# Patient Record
Sex: Female | Born: 1990
Health system: Southern US, Community
[De-identification: ages and names within clinical notes are randomized; demographics above are authoritative.]

---

## 2019-06-18 MED FILL — TRI FEMYNOR 28 TABLET: 0.18/0.215/ | 84 days supply | Qty: 84 | Fill #0

## 2019-09-12 MED FILL — TRI FEMYNOR 28 TABLET: 0.18/0.215/ | 84 days supply | Qty: 84 | Fill #1

## 2019-11-27 ENCOUNTER — Other Ambulatory Visit: Payer: Self-pay

## 2019-11-27 ENCOUNTER — Encounter (HOSPITAL_COMMUNITY): Payer: Self-pay | Admitting: Emergency Medicine

## 2019-11-27 ENCOUNTER — Emergency Department (HOSPITAL_COMMUNITY)
Admission: EM | Admit: 2019-11-27 | Discharge: 2019-11-28 | Disposition: A | Discharge status: Home / Self Care | Payer: 59 | Attending: Emergency Medicine | Admitting: Emergency Medicine

## 2019-11-27 DIAGNOSIS — R109 Unspecified abdominal pain: Secondary | ICD-10-CM | POA: Diagnosis not present

## 2019-11-27 DIAGNOSIS — R1084 Generalized abdominal pain: Secondary | ICD-10-CM | POA: Diagnosis present

## 2019-11-27 NOTE — ED Triage Notes (Signed)
Patient with abdominal pain since Sunday, getting progressively worse since Sunday.  Today was intermittent, but got worse after eating some soup.  No nausea, no vomiting, but the pain became severe.

## 2019-11-28 ENCOUNTER — Emergency Department (HOSPITAL_COMMUNITY): Payer: 59

## 2019-11-28 LAB — URINALYSIS, ROUTINE W REFLEX MICROSCOPIC
Bilirubin Urine: NEGATIVE
Glucose, UA: NEGATIVE mg/dL
Hgb urine dipstick: NEGATIVE
Ketones, ur: NEGATIVE mg/dL
Leukocytes,Ua: NEGATIVE
Nitrite: NEGATIVE
Protein, ur: NEGATIVE mg/dL
Specific Gravity, Urine: 1.013 (ref 1.005–1.030)
pH: 7 (ref 5.0–8.0)

## 2019-11-28 LAB — COMPREHENSIVE METABOLIC PANEL
ALT: 20 U/L (ref 0–44)
AST: 25 U/L (ref 15–41)
Albumin: 4 g/dL (ref 3.5–5.0)
Alkaline Phosphatase: 51 U/L (ref 38–126)
Anion gap: 12 (ref 5–15)
BUN: 13 mg/dL (ref 6–20)
CO2: 24 mmol/L (ref 22–32)
Calcium: 9.3 mg/dL (ref 8.9–10.3)
Chloride: 102 mmol/L (ref 98–111)
Creatinine, Ser: 0.76 mg/dL (ref 0.44–1.00)
GFR calc Af Amer: >60 mL/min (ref >60)
GFR calc non Af Amer: >60 mL/min (ref >60)
Glucose, Bld: 110 mg/dL — ABNORMAL HIGH (ref 70–99)
Potassium: 3.9 mmol/L (ref 3.5–5.1)
Sodium: 138 mmol/L (ref 135–145)
Total Bilirubin: 0.5 mg/dL (ref 0.3–1.2)
Total Protein: 7.1 g/dL (ref 6.5–8.1)

## 2019-11-28 LAB — I-STAT BETA HCG BLOOD, ED (MC, WL, AP ONLY): I-stat hCG, quantitative: <5 m[IU]/mL (ref <5)

## 2019-11-28 LAB — CBC WITH DIFFERENTIAL/PLATELET
Abs Immature Granulocytes: 0 10*3/uL (ref 0.00–0.07)
Basophils Absolute: 0 10*3/uL (ref 0.0–0.1)
Basophils Relative: 0 %
Eosinophils Absolute: 0.1 10*3/uL (ref 0.0–0.5)
Eosinophils Relative: 2 %
HCT: 41.1 % (ref 36.0–46.0)
Hemoglobin: 13.3 g/dL (ref 12.0–15.0)
Immature Granulocytes: 0 %
Lymphocytes Relative: 42 %
Lymphs Abs: 2.4 10*3/uL (ref 0.7–4.0)
MCH: 29.8 pg (ref 26.0–34.0)
MCHC: 32.4 g/dL (ref 30.0–36.0)
MCV: 92.2 fL (ref 80.0–100.0)
Monocytes Absolute: 0.4 10*3/uL (ref 0.1–1.0)
Monocytes Relative: 7 %
Neutro Abs: 2.8 10*3/uL (ref 1.7–7.7)
Neutrophils Relative %: 49 %
Platelets: 232 10*3/uL (ref 150–400)
RBC: 4.46 MIL/uL (ref 3.87–5.11)
RDW: 12.5 % (ref 11.5–15.5)
WBC: 5.7 10*3/uL (ref 4.0–10.5)
nRBC: 0 % (ref 0.0–0.2)

## 2019-11-28 LAB — LIPASE, BLOOD: Lipase: 33 U/L (ref 11–51)

## 2019-11-28 MED ORDER — SODIUM CHLORIDE 0.9 % IV BOLUS
1000.0000 mL | Freq: Once | INTRAVENOUS | Status: AC
  Administered 2019-11-28: 1000 mL via INTRAVENOUS

## 2019-11-28 MED ORDER — SIMETHICONE 40 MG/0.6ML PO SUSP
40.0000 mg | Freq: Once | ORAL | Status: AC
  Administered 2019-11-28: 40 mg via ORAL
  Filled 2019-11-28: qty 0.6

## 2019-11-28 MED ORDER — IOHEXOL 300 MG/ML  SOLN
100.0000 mL | Freq: Once | INTRAMUSCULAR | Status: AC | PRN
  Administered 2019-11-28: 100 mL via INTRAVENOUS

## 2019-11-28 NOTE — ED Provider Notes (Signed)
[facility] EMERGENCY DEPARTMENT Provider Note   CSN: [id number] Arrival date & time: 11/27/19  2344     History Chief Complaint  Patient presents with  . Abdominal Pain    Bethany Chapman is a 29 y.o. female.  29 y.o female with no PMH presents to the ED with a chief complaint of generalized abdominal pain x 3 days. Describes the pain as intermittent, generalized without focal point of tenderness. Has been taking omeprazole without improvement in symptoms. Reports pain has been ongoing but exacerbated with lying flat and palpation.  Patient does report normal bowel movements without any blood.  Able to eat well yesterday, however today after intake of soup felt this exacerbated the pain.  She has not taken any medication aside from antiacid's to help with pain.  No fevers, nausea, vomiting.  No prior surgical history to the abdomen.  Denies any vaginal bleeding, vaginal discharge.  She is currently on OCPs, no shortness of breath.  Patient is fully vaccinated for COVID-19.   The history is provided by the patient.       History reviewed. No pertinent past medical history.  There are no problems to display for this patient.   History reviewed. No pertinent surgical history.   OB History   No obstetric history on file.     History reviewed. No pertinent family history.  Social History   Tobacco Use  . Smoking status: Never Smoker  . Smokeless tobacco: Never Used  Substance Use Topics  . Alcohol use: Not Currently  . Drug use: Never    Home Medications Prior to Admission medications   Medication Sig Start Date End Date Taking? Authorizing Provider  TRI FEMYNOR 0.18/0.215/0.25 MG-35 MCG tablet Take 1 tablet by mouth daily. 09/12/19  Yes [provider]    Allergies    Patient has no known allergies.  Review of Systems   Review of Systems  Constitutional: Negative for chills and fever.  HENT: Negative for sore throat.     Respiratory: Negative for shortness of breath.   Cardiovascular: Negative for chest pain.  Gastrointestinal: Positive for abdominal pain. Negative for blood in stool, constipation, diarrhea, nausea and vomiting.  Genitourinary: Negative for dysuria, flank pain, vaginal bleeding and vaginal discharge.  Musculoskeletal: Negative for back pain and myalgias.  Skin: Negative for pallor and wound.  Neurological: Negative for light-headedness and headaches.  All other systems reviewed and are negative.   Physical Exam Updated Vital Signs BP 110/76 (BP Location: Left Arm)   Pulse 88   Temp 98.5 F (36.9 C) (Oral)   Resp 16   LMP 10/27/2019 (Exact Date)   SpO2 99%   Physical Exam Vitals and nursing note reviewed.  Constitutional:      Appearance: She is well-developed. She is ill-appearing.  HENT:     Head: Normocephalic and atraumatic.  Cardiovascular:     Rate and Rhythm: Normal rate.  Pulmonary:     Effort: Pulmonary effort is normal.     Breath sounds: No wheezing or rales.  Abdominal:     General: Abdomen is flat. Bowel sounds are normal.     Palpations: Abdomen is soft.     Tenderness: There is abdominal tenderness in the right lower quadrant, suprapubic area and left lower quadrant. There is no right CVA tenderness, left CVA tenderness or guarding.     Hernia: No hernia is present.  Skin:    General: Skin is warm and dry.  Neurological:  Mental Status: She is alert.     ED Results / Procedures / Treatments   Labs (all labs ordered are listed, but only abnormal results are displayed) Labs Reviewed  COMPREHENSIVE METABOLIC PANEL - Abnormal; Notable for the following components:      Result Value   Glucose, Bld 110 (*)    All other components within normal limits  CBC WITH DIFFERENTIAL/PLATELET  LIPASE, BLOOD  URINALYSIS, ROUTINE W REFLEX MICROSCOPIC  I-STAT BETA HCG BLOOD, ED ([facility], [facility], [facility] ONLY)    EKG None  Radiology CT ABDOMEN PELVIS W CONTRAST  Result  Date: 11/28/2019 CLINICAL DATA:  Diffuse abdominal pain EXAM: CT ABDOMEN AND PELVIS WITH CONTRAST TECHNIQUE: Multidetector CT imaging of the abdomen and pelvis was performed using the standard protocol following bolus administration of intravenous contrast. CONTRAST:  100 cc Omnipaque 300 intravenous COMPARISON:  None. FINDINGS: Lower chest: No acute abnormality. Hepatobiliary: No focal liver abnormality is seen. No gallstones, gallbladder wall thickening, or biliary dilatation. Pancreas: Unremarkable. No pancreatic ductal dilatation or surrounding inflammatory changes. Spleen: 10 mm circumscribed hypodensity within the anterior spleen. Adrenals/Urinary Tract: Adrenal glands are unremarkable. Kidneys are normal, without renal calculi, focal lesion, or hydronephrosis. Bladder is unremarkable. Stomach/Bowel: Stomach is within normal limits. Appendix appears normal. No evidence of bowel wall thickening, distention, or inflammatory changes. Vascular/Lymphatic: No significant vascular findings are present. No enlarged abdominal or pelvic lymph nodes. Reproductive: Differential enhancement of the uterus and cervix. No adnexal mass Other: Negative for free air.  No significant effusion Musculoskeletal: No acute or significant osseous findings. IMPRESSION: 1. No CT evidence for acute intra-abdominal or pelvic abnormality. 2. 10 mm circumscribed probably benign lesion in the anterior spleen. Consider 6 month follow-up MRI to document stability. Electronically Signed   By: Jasmine PangKim  Fujinaga M.D.   On: 11/28/2019 03:18   US PELVIC COMPLETE W TRANSVAGINAL AND TORSION R/O  Result Date: 11/28/2019 CLINICAL DATA:  Abdominal pain for 3 days EXAM: TRANSABDOMINAL AND TRANSVAGINAL ULTRASOUND OF PELVIS DOPPLER ULTRASOUND OF OVARIES TECHNIQUE: Both transabdominal and transvaginal ultrasound examinations of the pelvis were performed. Transabdominal technique was performed for global imaging of the pelvis including uterus, ovaries, adnexal  regions, and pelvic cul-de-sac. It was necessary to proceed with endovaginal exam following the transabdominal exam to visualize the ovaries and endometrium. Color and duplex Doppler ultrasound was utilized to evaluate blood flow to the ovaries. COMPARISON:  CT 11/28/2019 FINDINGS: Uterus Measurements: 5.8 x 2.3 x 3 cm = volume: 20 mL. Anteverted. No fibroids or other mass visualized. Endometrium Thickness: 3 mm.  No focal abnormality visualized. Right ovary Measurements: 3.1 x 2 x 1.5 cm = volume: 5 mL. Normal appearance/no adnexal mass. Left ovary Measurements: 2.7 x 2 x 2.9 cm = volume: 8 mL. Normal appearance/no adnexal mass. Pulsed Doppler evaluation of both ovaries demonstrates normal low-resistance arterial and venous waveforms. Other findings Minimal free fluid, likely physiologic. IMPRESSION: Normal examination. Electronically Signed   By: Burman NievesWilliam  Stevens M.D.   On: 11/28/2019 04:48    Procedures Procedures (including critical care time)  Medications Ordered in ED Medications  sodium chloride 0.9 % bolus 1,000 mL (0 mLs Intravenous Stopped 11/28/19 0046)  iohexol (OMNIPAQUE) 300 MG/ML solution 100 mL (100 mLs Intravenous Contrast Given 11/28/19 0310)  simethicone (MYLICON) 40 mg/0.846ml suspension 40 mg (40 mg Oral Given 11/28/19 0458)    ED Course  I have reviewed the triage vital signs and the nursing notes.  Pertinent labs & imaging results that were available during my care of the  patient were reviewed by me and considered in my medical decision making (see chart for details).    MDM Rules/Calculators/A&P   Patient with no pertinent past medical history presents to the ED with a chief complaint of generalized abdominal pain for the past 3 days.  This has been intermittent, generalized without any focal point of tenderness.  Does report worsening after food intake along with lying flat along with palpation.  Bowel movements have been normal without any blood in her stool.  She has not had  any nausea or vomiting episodes.  No fevers.  Has been taking antiacid's in order to help with pain without much improvement.  During primary evaluation patient is overall ill-appearing, abdomen is soft, mildly tender to palpation along the suprapubic and right lower quadrant, bowel sounds are present without any guarding or rebound.  Is are clear to auscultation without any rhonchi, wheezing or rales.  No calf tenderness, no bilateral pitting edema.  No CVA tenderness bilaterally.  Sounds are equal and present throughout upper and lower extremities.  Differential diagnosis included but not limited to appendicitis versus ovarian torsion versus diverticulitis.  Interpretation of her labs revealed a CBC without any leukocytosis, hemoglobin is within normal limits without any signs of anemia.  CMP without any electrolyte derangement, creatinine level is within normal limits, LFTS are remarkable there is no pain with palpation to the right upper quadrant, has been no nausea or vomiting, patient does currently have her gallbladder I have a lower suspicion for gallbladder pathology at this time.  Lipase level is within normal limits.  Beta hCG is negative.  Patient is currently on OCPs.  UA without any nitrites, leukocytes or signs of infection.  CT Abdomen pelvis with contrasts showed: 1. No CT evidence for acute intra-abdominal or pelvic abnormality.  2. 10 mm circumscribed probably benign lesion in the anterior  spleen. Consider 6 month follow-up MRI to document stability.   These results were discussed at length with patient, she was advised to follow-up with regards to her benign anterior lesion.  This will also be placed on discharge paperwork at this time.  She's not have vaginal discharge, vaginal bleeding, low concern for sexually transmitted infection.  We discussed likely intermittent torsion.  Will further evaluate this with ultrasound of her pelvis.  Patient was offered pain medication but  declines at this time.  She did receive 1 L bolus for rehydration purposes.  4:03 AM spoke to patient with regards of obtaining ultrasound to further evaluate intermittent torsion, we discussed that there is a possibility of ultrasound missing this if this is happening intermittently.  She is agreeable of exam at this time.  Ultrasound of her pelvis was ordered which showed:  Normal flow to bilateral ovaries.  No acute pathology noted, no signs of torsion.  05:00 AM results were discussed at length with patient, we discussed appropriate follow-up with primary care.  To slight enlargement of colon on CT scan, we discussed symptomatic treatment with Gas-X.  However, patient should return back to the emergency department if symptoms do not improve.  Does have remained stable, she continues to be afebrile without any episodes of vomiting.  She is stable for discharge at this time.  I discussed case with my attending Dr. Erin Hearing who has also evaluated patient and agrees with plan and management.   Portions of this note were generated with Scientist, clinical (histocompatibility and immunogenetics). Dictation errors may occur despite best attempts at proofreading.  Final Clinical Impression(s) / ED Diagnoses  Final diagnoses:  Abdominal pain    Rx / DC Orders ED Discharge Orders    None       Claude Manges, PA-C 11/28/19 0354    Mesner, Barbara Cower, MD 11/28/19 [phone]

## 2019-11-28 NOTE — ED Notes (Signed)
Patient transported to Ultrasound 

## 2019-11-28 NOTE — Discharge Instructions (Addendum)
You laboratory results were within normal limits.  Your CT abdomen and pelvis showed: 10 mm circumscribed probably benign lesion in the anterior  spleen. Consider 6 month follow-up MRI to document stability.   Your Ultrasound of the pelvis was within normal limits.

## 2019-11-28 NOTE — ED Provider Notes (Signed)
Medical screening examination/treatment/procedure(s) were conducted as a shared visit with non-physician practitioner(s) and myself.  I personally evaluated the patient during the encounter.  Patient with lower abdominal pain and suprapubic pain.  Seems to be intermittent over the last 2 to 3 days.  Symptoms worse with certain positions sometimes not.  One episode was after eating but not other ones were related to eating.  No vaginal discharge, bleeding or urinary symptoms.  No fevers.  No nausea.  No abnormal stools. CT scans already negative and rest of her lab work is complete within normal limits.  The only other concern for possibility that get intermittent ovarian torsion so we will get ultrasound to evaluate for any cysts or any high risk factors for that.  She knows about her splenic abnormality and the need for MRI. Also consider possible gas however this is a diagnosis of exclusion and she understands that she needs to follow-up either here or with her primary doctor if not improving or worsening.        Pavielle Biggar, Barbara Cower, MD 11/28/19 [phone]

## 2019-11-28 NOTE — ED Notes (Signed)
Patient taken to CT.

## 2019-11-28 NOTE — ED Notes (Signed)
Returned from U/S

## 2019-12-11 MED FILL — TRI FEMYNOR 28 TABLET: 0.18/0.215/ | 84 days supply | Qty: 84 | Fill #0

## 2020-01-15 ENCOUNTER — Other Ambulatory Visit (HOSPITAL_COMMUNITY): Payer: Self-pay | Admitting: Nurse Practitioner

## 2020-03-04 MED FILL — TRI FEMYNOR 28 TABLET: 0.18/0.215/ | 84 days supply | Qty: 84 | Fill #0

## 2020-05-20 MED FILL — TRI FEMYNOR 28 TABLET: 0.18/0.215/ | 28 days supply | Qty: 28 | Fill #1

## 2020-06-23 MED FILL — TRI FEMYNOR 28 TABLET: 0.18/0.215/ | 28 days supply | Qty: 28 | Fill #2

## 2021-12-25 IMAGING — US US PELVIS COMPLETE TRANSABD/TRANSVAG W DUPLEX
1 series · 13 of 25 positions shown · non-contrast
Comparison: CT 11/28/2019

CLINICAL DATA: Abdominal pain for 3 days



[Series 1: us pelvic complete w transvaginal and torsion righ · 13 of 79 slices shown]
[im 1/79]
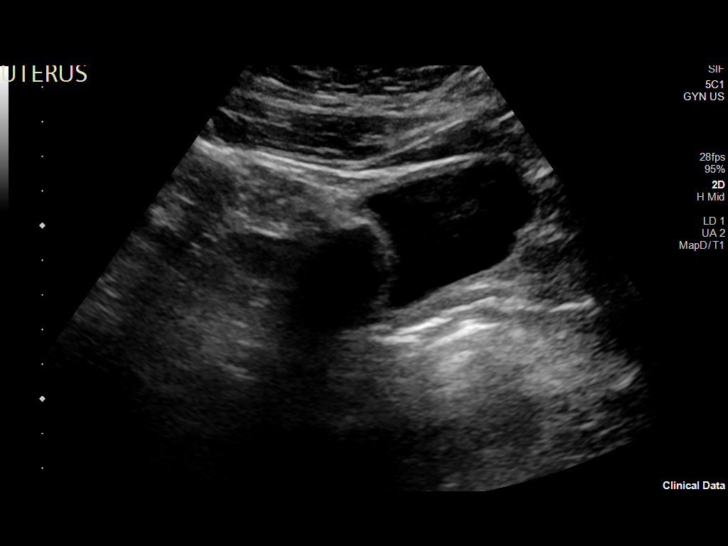
[im 7/79]
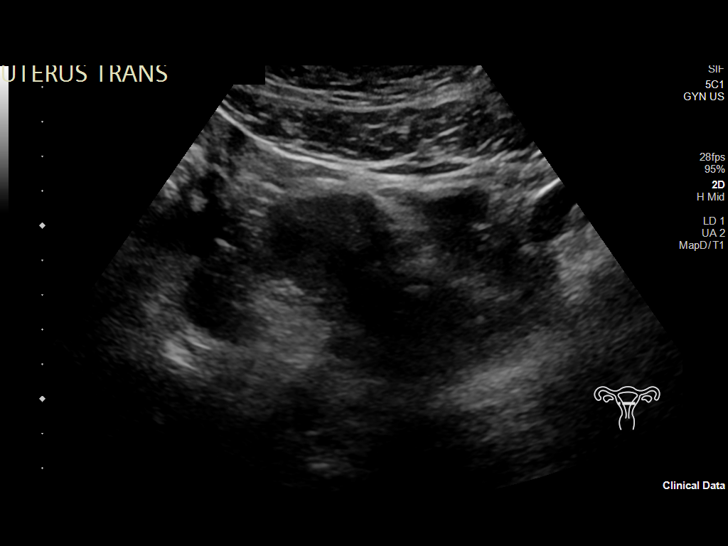
[im 14/79]
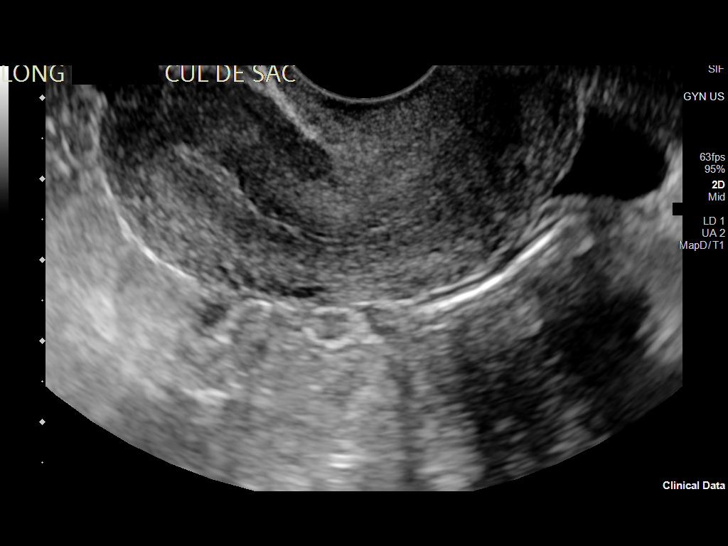
[im 20/79]
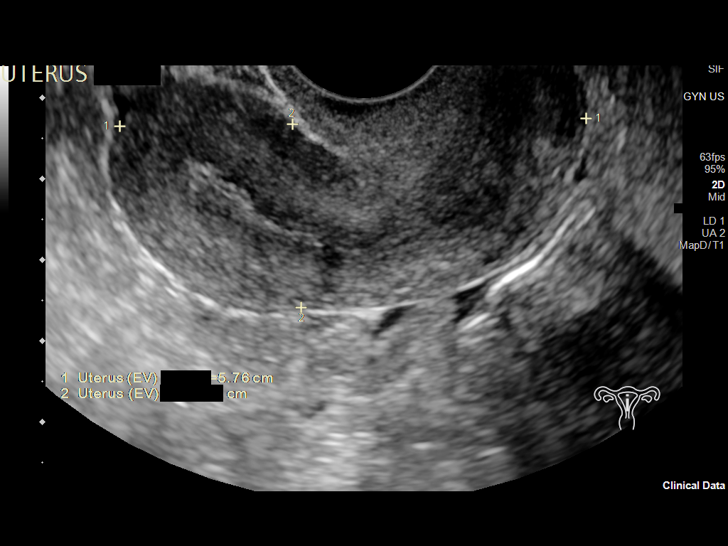
[im 27/79]
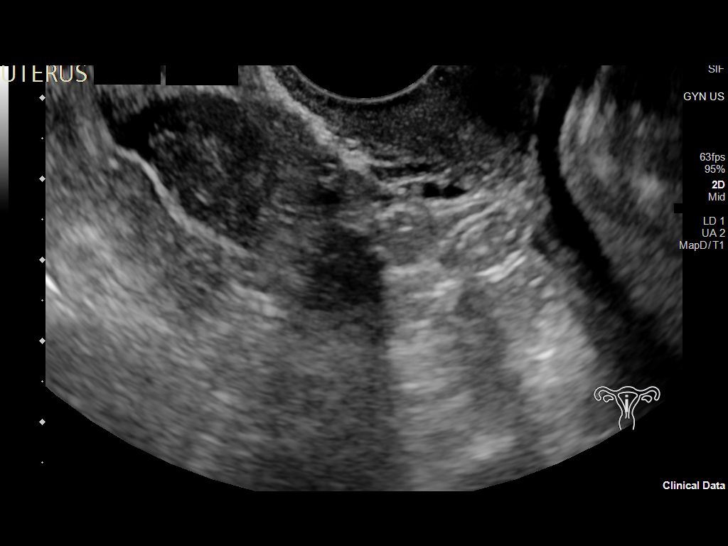
[im 33/79]
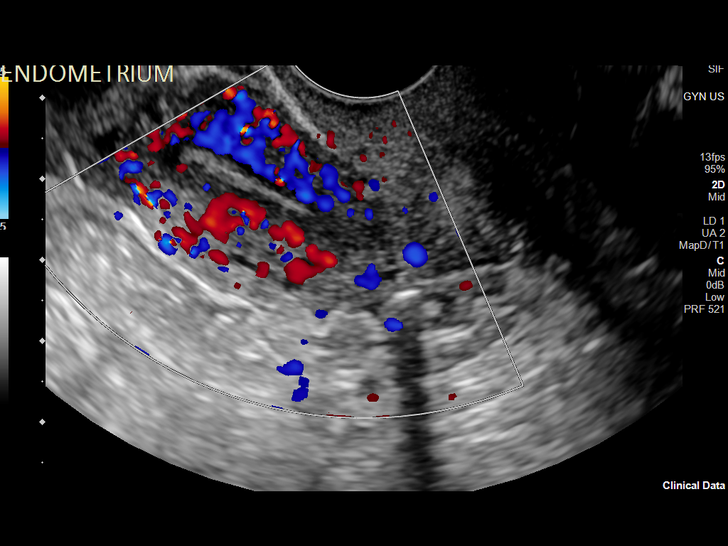
[im 40/79]
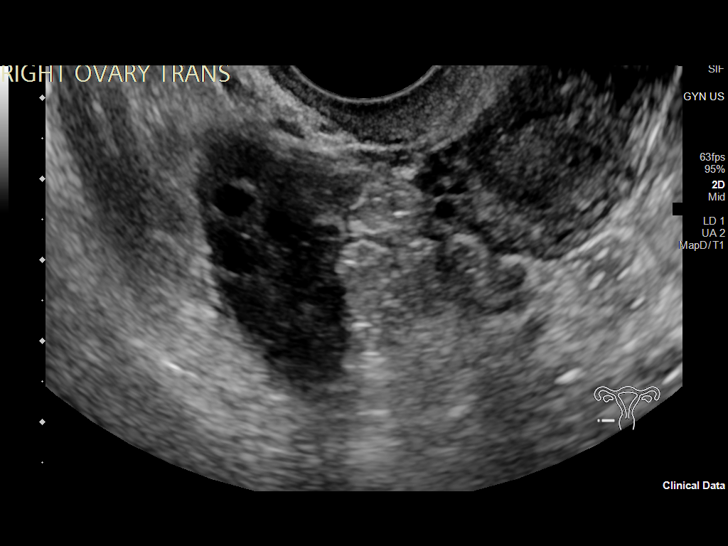
[im 46/79]
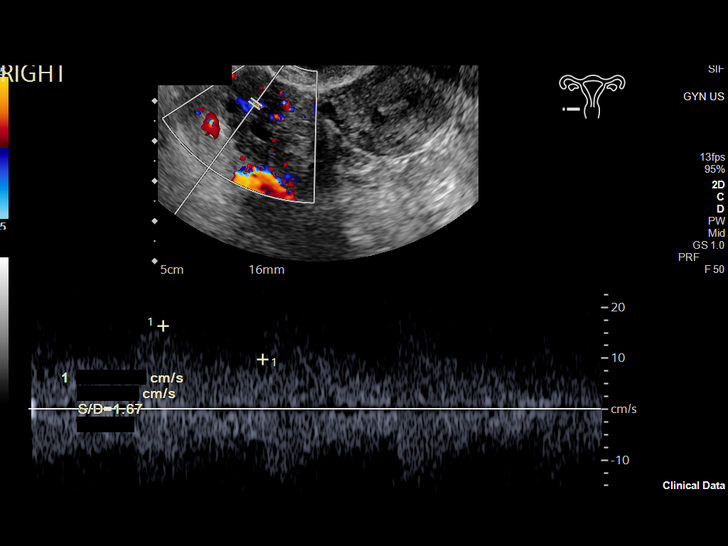
[im 53/79]
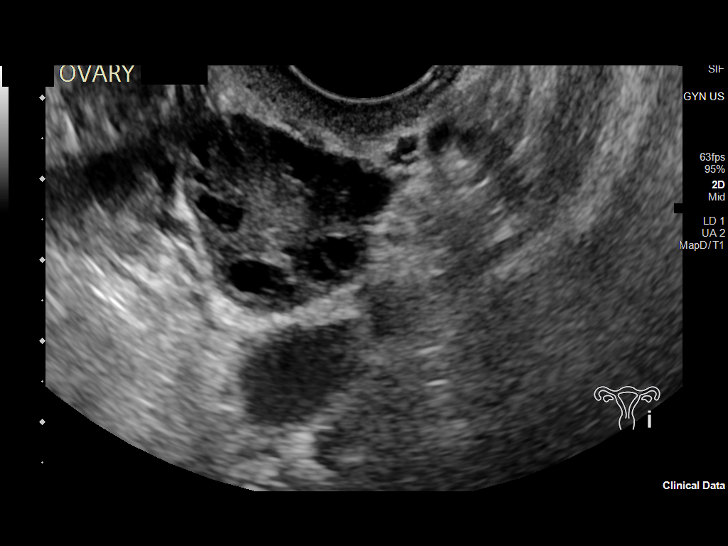
[im 59/79]
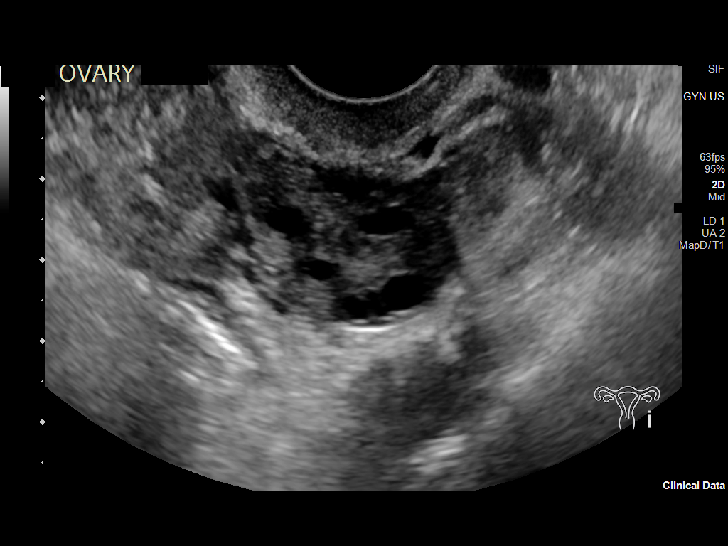
[im 66/79]
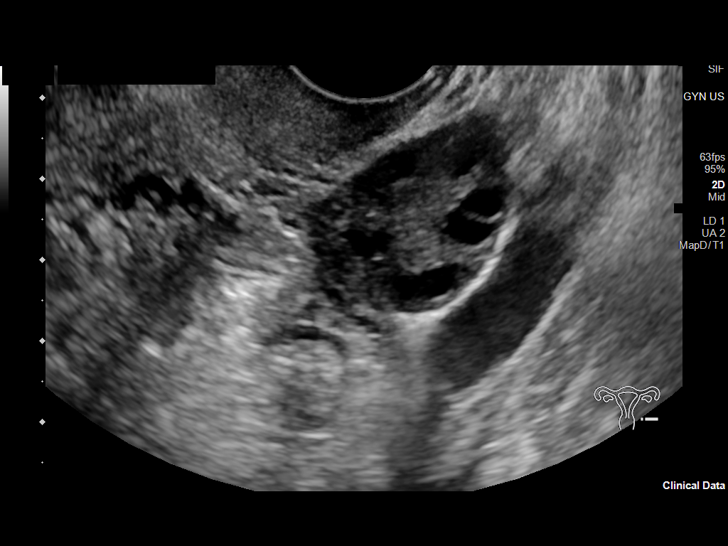
[im 72/79]
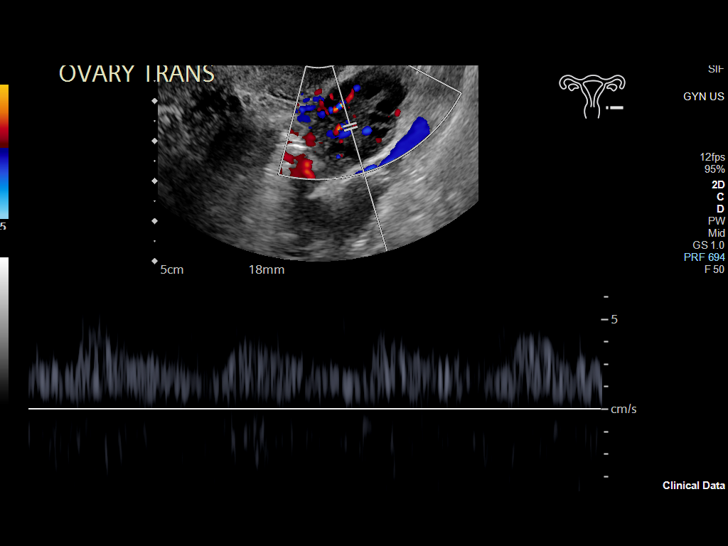
[im 79/79]
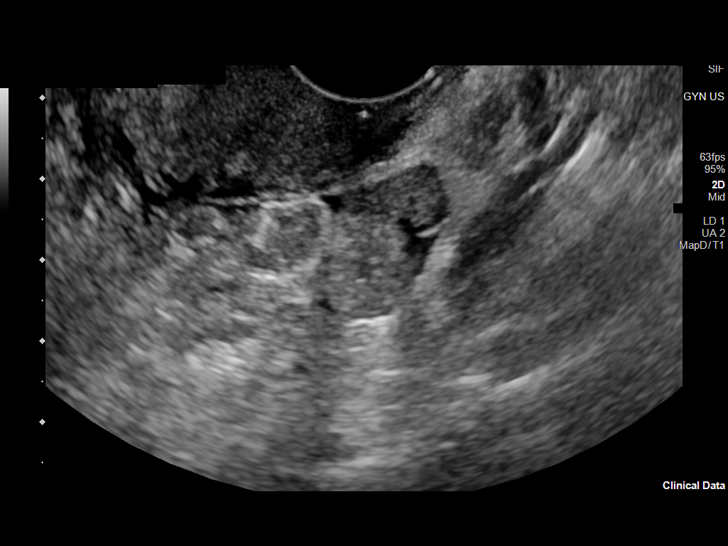

[13 of 25 positions shown; findings below may reference images not displayed]

FINDINGS: Uterus

Measurements: 5.8 x 2.3 x 3 cm = volume: 20 mL. Anteverted. No
fibroids or other mass visualized.

Endometrium

Thickness: 3 mm.  No focal abnormality visualized.

Right ovary

Measurements: 3.1 x 2 x 1.5 cm = volume: 5 mL. Normal appearance/no
adnexal mass.

Left ovary

Measurements: 2.7 x 2 x 2.9 cm = volume: 8 mL. Normal appearance/no
adnexal mass.

Pulsed Doppler evaluation of both ovaries demonstrates normal
low-resistance arterial and venous waveforms.

Other findings

Minimal free fluid, likely physiologic.
IMPRESSION: Normal examination.

## 2021-12-25 IMAGING — CT CT ABD-PELV W/ CM
2 of 4 series · 16 of 46 positions shown, 18 images · IV contrast (Omni 300)
Comparison: None.

CLINICAL DATA: Diffuse abdominal pain

EXAM:
CT ABDOMEN AND PELVIS WITH CONTRAST
TECHNIQUE: Multidetector CT imaging of the abdomen and pelvis was performed
using the standard protocol following bolus administration of
intravenous contrast.
CONTRAST:  100 cc Omnipaque 300 intravenous

[Series 3: a/p w/ 5mm · axial · 0.60mm/px · z∈[+764,+1180]mm · 13 of 91 slices shown, 15 images]
[im 4/91  soft-tissue]
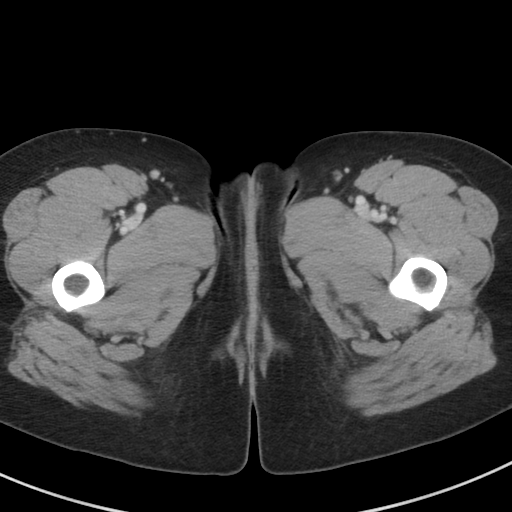
[im 4/91  bone]
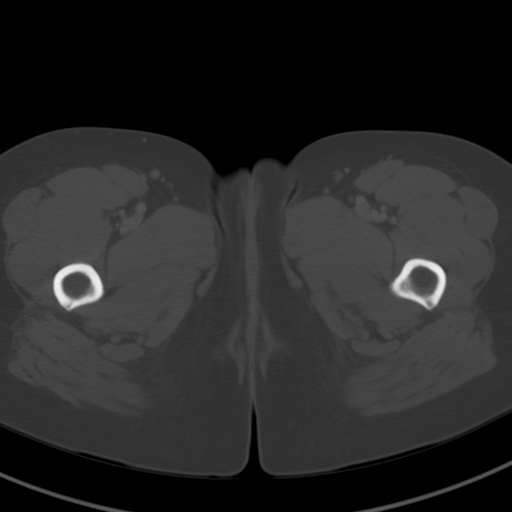
[im 11/91  soft-tissue]
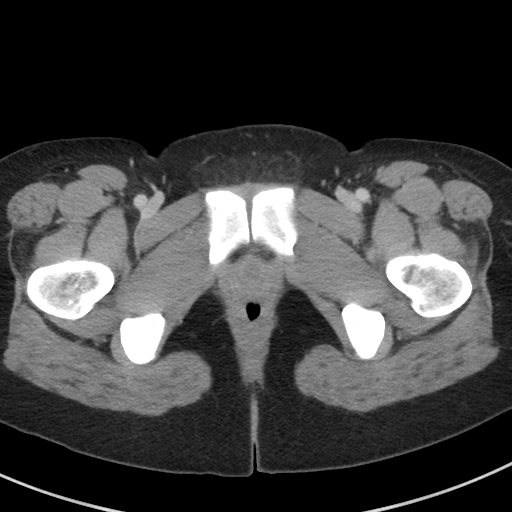
[im 19/91  soft-tissue]
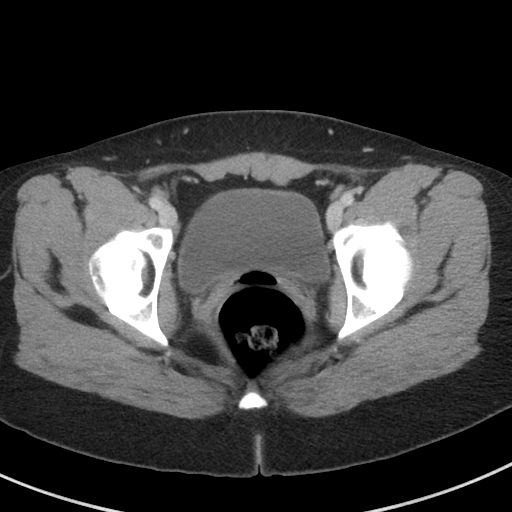
[im 26/91  soft-tissue]
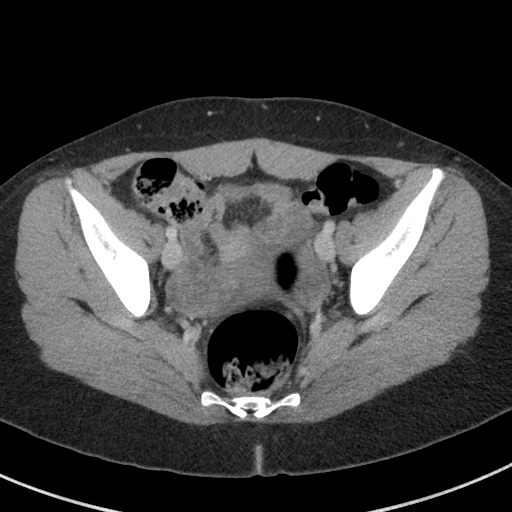
[im 33/91  soft-tissue]
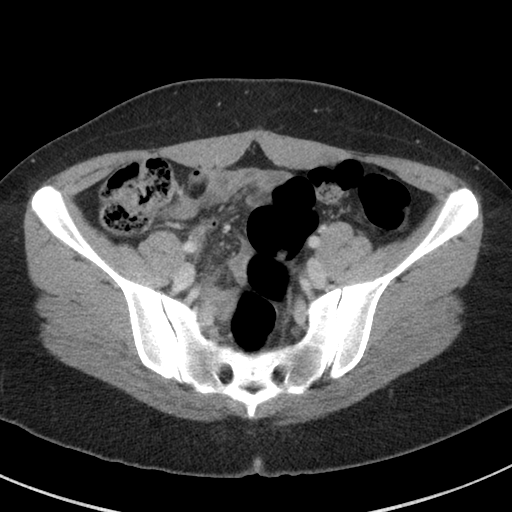
[im 40/91  soft-tissue]
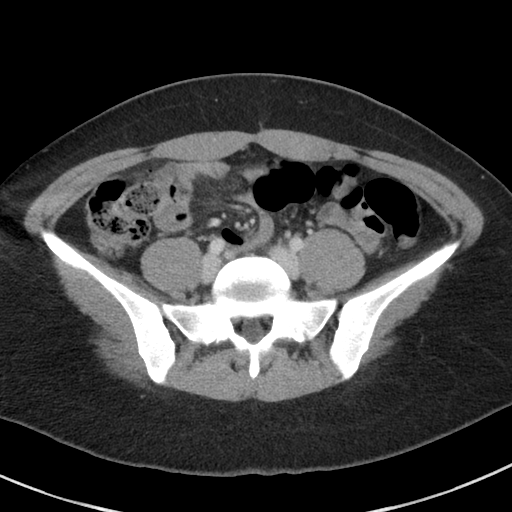
[im 47/91  soft-tissue]
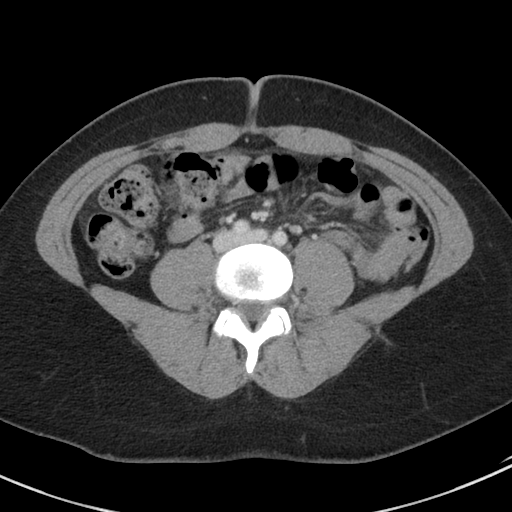
[im 51/91  soft-tissue]
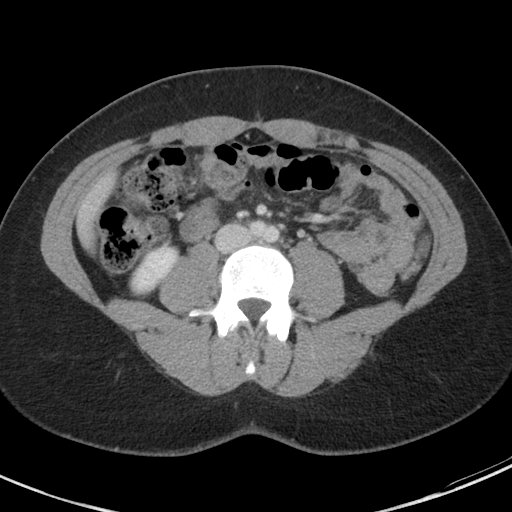
[im 58/91  soft-tissue]
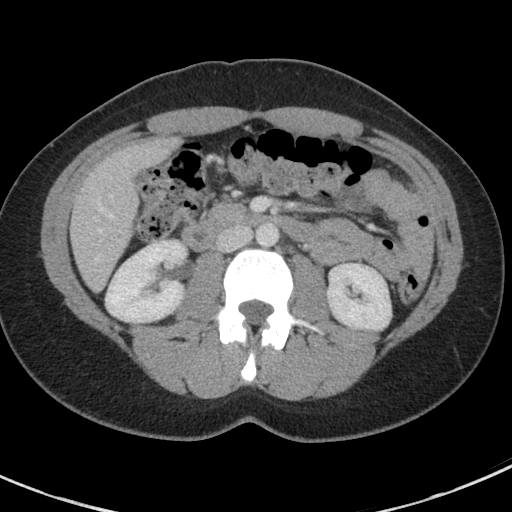
[im 58/91  bone]
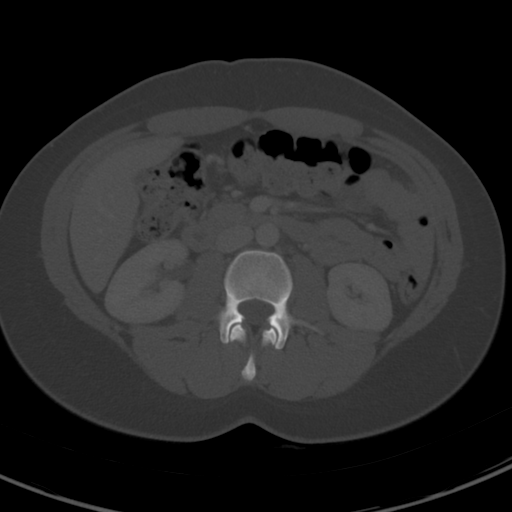
[im 65/91  soft-tissue]
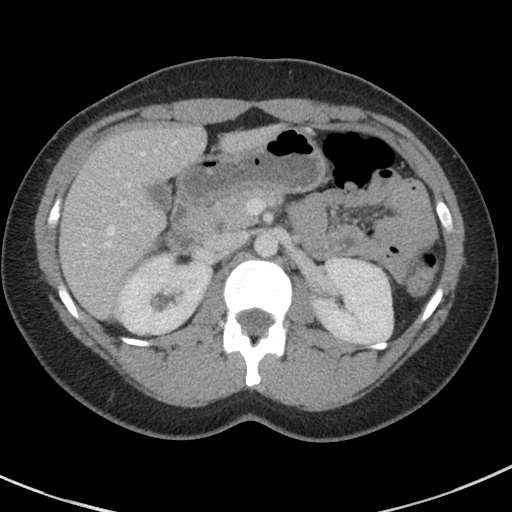
[im 73/91  soft-tissue]
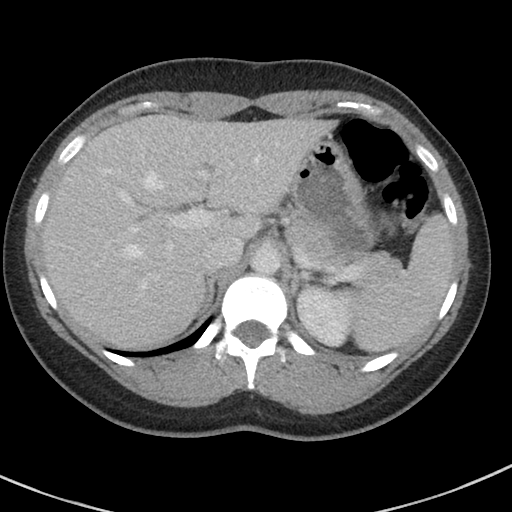
[im 80/91  soft-tissue]
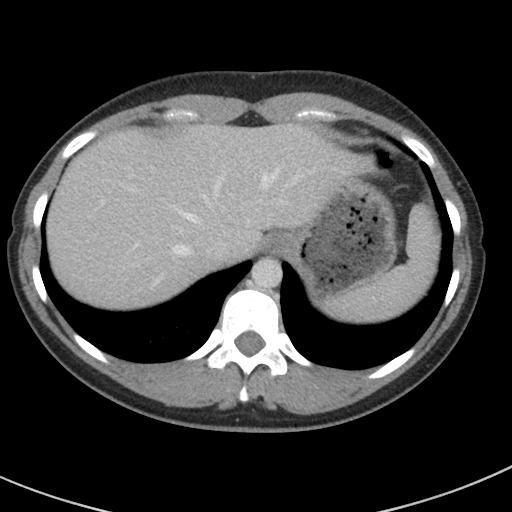
[im 87/91  soft-tissue]
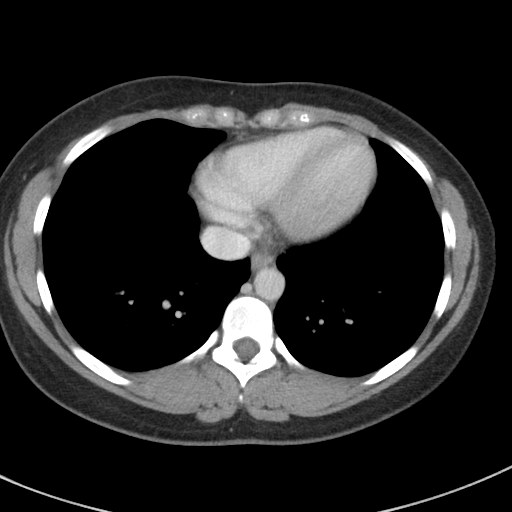

[Series 6: a/p w/ cor · coronal · 0.68mm/px · 3 of 115 slices shown]
[im 39/115  soft-tissue]
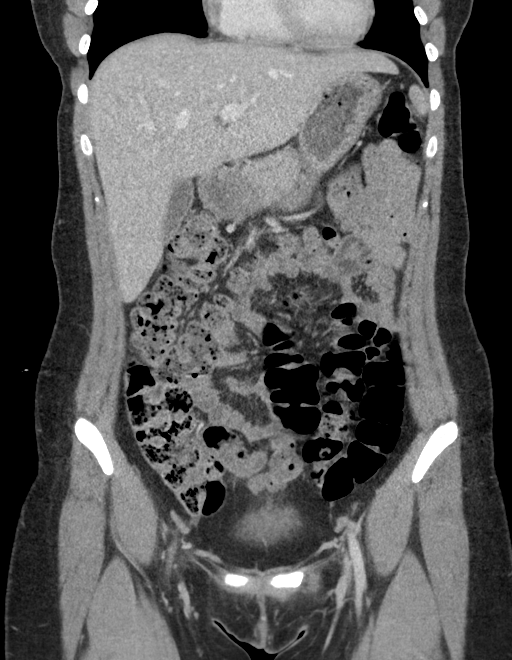
[im 51/115  soft-tissue]
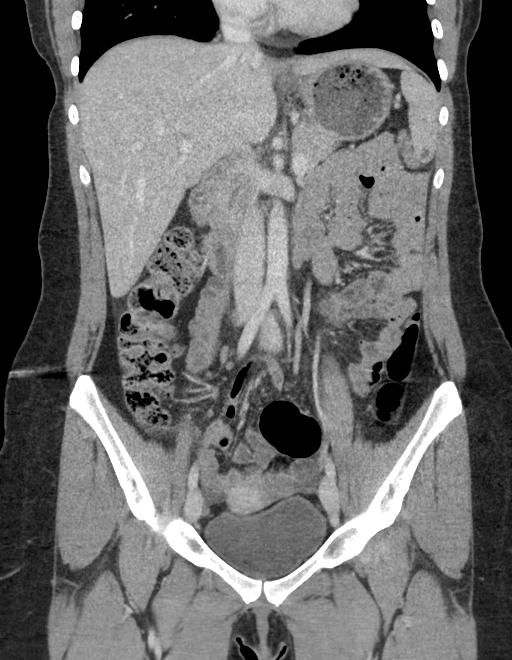
[im 64/115  soft-tissue]
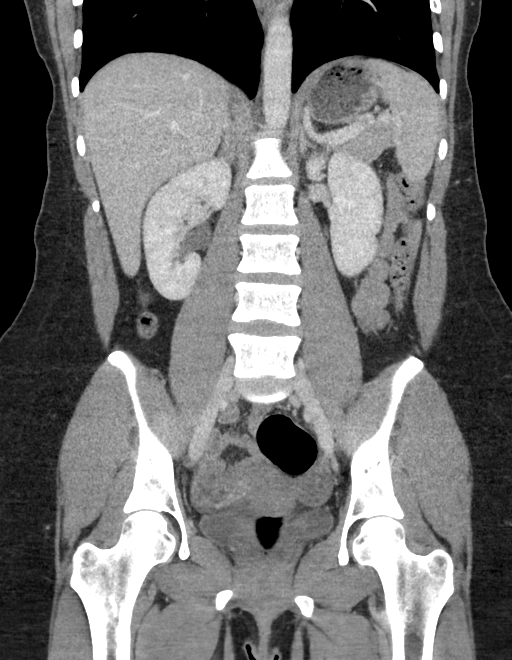

[16 of 46 positions shown; findings below may reference images not displayed]

FINDINGS: Lower chest: No acute abnormality.

Hepatobiliary: No focal liver abnormality is seen. No gallstones,
gallbladder wall thickening, or biliary dilatation.

Pancreas: Unremarkable. No pancreatic ductal dilatation or
surrounding inflammatory changes.

Spleen: 10 mm circumscribed hypodensity within the anterior spleen.

Adrenals/Urinary Tract: Adrenal glands are unremarkable. Kidneys are
normal, without renal calculi, focal lesion, or hydronephrosis.
Bladder is unremarkable.

Stomach/Bowel: Stomach is within normal limits. Appendix appears
normal. No evidence of bowel wall thickening, distention, or
inflammatory changes.

Vascular/Lymphatic: No significant vascular findings are present. No
enlarged abdominal or pelvic lymph nodes.

Reproductive: Differential enhancement of the uterus and cervix. No
adnexal mass

Other: Negative for free air.  No significant effusion

Musculoskeletal: No acute or significant osseous findings.
IMPRESSION: 1. No CT evidence for acute intra-abdominal or pelvic abnormality.
2. 10 mm circumscribed probably benign lesion in the anterior
spleen. Consider 6 month follow-up MRI to document stability.

## 2022-09-16 ENCOUNTER — Other Ambulatory Visit: Payer: Self-pay

## 2022-09-16 NOTE — Progress Notes (Signed)
,   Celso Amy, PA-C [address]  [address]  [place], [place] [address]  Main: [phone]  Fax: [phone]   Gastroenterology Consultation  Referring Provider:     Tyson Dense, CNM Primary Care Physician:  Patient, No Pcp Per Primary Gastroenterologist:  Celso Amy, PA-C / Dr. Wyline Mood  Reason for Consultation:     Abnormal Bowel Movements.        HPI:   Bethany Chapman is a 32 y.o. y/o female referred for consultation & management  by Patient, No Pcp Per.    Gave Birth - Spontaneous Vaginal Delivery 12/2021 with no post-partum complications.  Had Type 3a perineal laceration.  She states she healed well from that.  She has some mild constipation and took OTC fiber and Colace stool softener with benefit.  She is having soft bowel movement every day.  Denies any hard stools, straining, diarrhea, rectal bleeding, abdominal pain, weight loss, nausea, or vomiting.  She started having rectal discharge in the past few weeks.  She notes yellow and orange rectal discharge for 1 week.  3 days ago she developed increased tenesmus with fecal urgency passing very soft small bowel movements.  Had liquid diarrhea yesterday.  No bowel movement this morning.  Last CT Abd / Pelvis with contrast 11/2019, to eval. diffuse abd pain, showed no acute abnormality.  Incidental 10mm benign appearing lesion in the spleen.  6 month f/u MRI was recommended (not done yet).  No previous GI evaluation or colonoscopy.  Denies family history of colon cancer or IBD.  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  Currently breast Feeding 77 month old daughter.  Has an IUD and denies pregnancy.  History reviewed. No pertinent past medical history.  History reviewed. No pertinent surgical history.  Prior to Admission medications   Medication Sig Start Date End Date Taking? Authorizing Provider  Mirena IUD Inserted into Uterus 01/2022   Sheilah Mins, NP           History reviewed. No  pertinent family history.   Social History   Tobacco Use   Smoking status: Never   Smokeless tobacco: Never  Substance Use Topics   Alcohol use: Not Currently   Drug use: Never    Allergies as of 09/17/2022   (No Known Allergies)    Review of Systems:    All systems reviewed and negative except where noted in HPI.   Physical Exam:  BP 109/73   Pulse 77   Temp 98.1 F (36.7 C)   Ht 5\' 2"  (1.575 m)   Wt 148 lb 3.2 oz (67.2 kg)   BMI 27.11 kg/m  No LMP recorded. Psych:  Alert and cooperative. Normal mood and affect. General:   Alert,  Well-developed, well-nourished, pleasant and cooperative in NAD Head:  Normocephalic and atraumatic. Eyes:  Sclera clear, no icterus.   Conjunctiva pink. Neck:  Supple; no masses or thyromegaly. Lungs:  Respirations even and unlabored.  Clear throughout to auscultation.   No wheezes, crackles, or rhonchi. No acute distress. Heart:  Regular rate and rhythm; no murmurs, clicks, rubs, or gallops. Abdomen:  Normal bowel sounds.  No bruits.  Soft, and non-distended without masses, hepatosplenomegaly or hernias noted.  No Tenderness.  No guarding or rebound tenderness.    Neurologic:  Alert and oriented x3;  grossly normal neurologically. Psych:  Alert and cooperative. Normal mood and affect.  Imaging Studies: No results found.  Assessment and Plan:   Bethany Balloon  Chapman is a 32 y.o. y/o female has been referred for abnormal bowel movements with loose stools and rectal discharge.  Differential includes infectious diarrhea versus inflammatory bowel disease.  I am ordering stool studies and colonoscopy for further evaluation given her sudden change in bowel habits.  Diarrhea - Rule out Infection or IBD  Stool studies: Pathogen panel, C. difficile PCR, fecal calprotectin  Rectal Discharge  Stool studies and Colonoscopy  Change in Bowel Habits  Scheduling Colonoscopy I discussed risks of colonoscopy with patient to include risk of bleeding,  colon perforation, and risk of sedation.  Patient expressed understanding and agrees to proceed with colonoscopy.  **Note: Breast-feeding 35-month-old daughter.  I instructed her to pump and dump her breast milk for 24 hours after having the colonoscopy with propofol sedation.  She expressed understanding.  Spleen Lesion  Pt. Declined Abdominal MRI today.  We will discuss again at her next follow-up office visit.  Follow up in 4 weeks after colonoscopy with TG.  Celso Amy, PA-C

## 2022-09-17 ENCOUNTER — Encounter: Payer: Self-pay | Admitting: Physician Assistant

## 2022-09-17 ENCOUNTER — Ambulatory Visit (INDEPENDENT_AMBULATORY_CARE_PROVIDER_SITE_OTHER): Payer: BC Managed Care – PPO | Admitting: Physician Assistant

## 2022-09-17 ENCOUNTER — Other Ambulatory Visit: Payer: Self-pay

## 2022-09-17 VITALS — BP 109/73 | HR 77 | Temp 98.1°F | Ht 62.0 in | Wt 148.2 lb

## 2022-09-17 DIAGNOSIS — R194 Change in bowel habit: Secondary | ICD-10-CM | POA: Diagnosis not present

## 2022-09-17 DIAGNOSIS — R198 Other specified symptoms and signs involving the digestive system and abdomen: Secondary | ICD-10-CM

## 2022-09-17 DIAGNOSIS — R197 Diarrhea, unspecified: Secondary | ICD-10-CM | POA: Diagnosis not present

## 2022-09-17 MED ORDER — SUTAB 1479-225-188 MG PO TABS: 1.0000 | ORAL_TABLET | Freq: Two times a day (BID) | ORAL | 0 refills | Status: DC

## 2022-09-17 MED ORDER — PEG 3350-KCL-NA BICARB-NACL 420 G PO SOLR: 4000.0000 mL | Freq: Once | ORAL | 0 refills | Status: AC

## 2022-09-17 NOTE — Patient Instructions (Signed)
You will need to "pump and dump your breast milk for 24 hours after your procedure.

## 2022-09-18 LAB — CLOSTRIDIUM DIFFICILE BY PCR: Toxigenic C. Difficile by PCR: NEGATIVE

## 2022-09-21 ENCOUNTER — Telehealth: Payer: Self-pay

## 2022-09-21 NOTE — Progress Notes (Signed)
GI pathogen panel is positive for C. difficile toxin A/B, however C. difficile PCR stool test is negative.  The PCR test is more accurate.  Patient does not have C. difficile.  GI pathogen panel is negative for all other infections.  Fecal calprotectin stool test is still pending (to check for IBD).  Continue with current plan.

## 2022-09-21 NOTE — Telephone Encounter (Signed)
Patient notified.   GI pathogen panel is positive for C. difficile toxin A/B, however C. difficile PCR stool test is negative.  The PCR test is more accurate.  Patient does not have C. difficile.  GI pathogen panel is negative for all other infections.  Fecal calprotectin stool test is still pending (to check for IBD).  Continue with current plan.

## 2022-09-24 ENCOUNTER — Telehealth: Payer: Self-pay

## 2022-09-24 LAB — GI PROFILE, STOOL, PCR

## 2022-09-24 LAB — CALPROTECTIN, FECAL: Calprotectin, Fecal: 39 ug/g (ref 0–120)

## 2022-09-24 NOTE — Telephone Encounter (Signed)
Patient notified-also changed pharmacy to [facility] [address] per request of patient-  Notify patient fecal calprotectin test is normal.  No evidence of bowel inflammation or IBD.  Continue with current plan.

## 2022-09-24 NOTE — Progress Notes (Signed)
Notify patient fecal calprotectin test is normal.  No evidence of bowel inflammation or IBD.  Continue with current plan.

## 2022-10-22 ENCOUNTER — Encounter: Payer: Self-pay | Admitting: Physician Assistant

## 2022-11-16 ENCOUNTER — Ambulatory Visit: Admit: 2022-11-16 | Payer: BC Managed Care – PPO | Admitting: Gastroenterology

## 2022-11-16 SURGERY — COLONOSCOPY WITH PROPOFOL
Anesthesia: General

## 2022-12-22 ENCOUNTER — Ambulatory Visit: Payer: BC Managed Care – PPO | Admitting: Physician Assistant

## 2023-07-29 ENCOUNTER — Ambulatory Visit
Admission: EM | Admit: 2023-07-29 | Discharge: 2023-07-29 | Disposition: A | Discharge status: Home / Self Care | Attending: Emergency Medicine | Admitting: Emergency Medicine

## 2023-07-29 DIAGNOSIS — J039 Acute tonsillitis, unspecified: Secondary | ICD-10-CM | POA: Diagnosis not present

## 2023-07-29 DIAGNOSIS — J0100 Acute maxillary sinusitis, unspecified: Secondary | ICD-10-CM

## 2023-07-29 DIAGNOSIS — J029 Acute pharyngitis, unspecified: Secondary | ICD-10-CM

## 2023-07-29 LAB — POCT RAPID STREP A (OFFICE): Rapid Strep A Screen: NEGATIVE

## 2023-07-29 MED ORDER — AMOXICILLIN 875 MG PO TABS: 875.0000 mg | ORAL_TABLET | Freq: Two times a day (BID) | ORAL | 0 refills | Status: AC

## 2023-07-29 NOTE — Discharge Instructions (Addendum)
 The strep test is negative.    Take the amoxicillin as directed.  Follow-up with your primary care provider if your symptoms are not improving.

## 2023-07-29 NOTE — ED Provider Notes (Signed)
 Bethany Chapman    CSN: [id number] Arrival date & time: 07/29/23  0919      History   Chief Complaint Chief Complaint  Patient presents with   Otalgia   Sore Throat    HPI Bethany Chapman is a 33 y.o. female.  Patient presents with 1 week history of sore throat, left ear pain, tender swollen lymph nodes in her neck.  Treating with ibuprofen.  No fever, cough, shortness of breath, vomiting, diarrhea.  The history is provided by the patient and medical records.    History reviewed. No pertinent past medical history.  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 No pertinent surgical history.  OB History   No obstetric history on file.      Home Medications    Prior to Admission medications   Medication Sig Start Date End Date Taking? Authorizing Provider  amoxicillin (AMOXIL) 875 MG tablet Take 1 tablet (875 mg total) by mouth 2 (two) times daily for 10 days. 07/29/23 08/08/23 Yes Mickie Bail, NP  levonorgestrel (MIRENA) 20 MCG/DAY IUD 1 each by Intrauterine route once. Inserted 01/2022 by GYN    [provider]    Family History History reviewed. No pertinent family history.  Social History Social History   Tobacco Use   Smoking status: Never   Smokeless tobacco: Never  Vaping Use   Vaping status: Never Used  Substance Use Topics   Alcohol use: Not Currently   Drug use: Never     Allergies   Patient has no known allergies.   Review of Systems Review of Systems  Constitutional:  Negative for chills and fever.  HENT:  Positive for ear pain and sore throat. Negative for trouble swallowing and voice change.   Respiratory:  Negative for cough and shortness of breath.      Physical Exam Triage Vital Signs ED Triage Vitals  Encounter Vitals Group     BP      Systolic BP Percentile      Diastolic BP Percentile       Pulse      Resp      Temp      Temp src      SpO2      Weight      Height      Head Circumference      Peak Flow      Pain Score      Pain Loc      Pain Education      Exclude from Growth Chart    No data found.  Updated Vital Signs BP 115/73   Pulse 78   Temp 98 F (36.7 C)   Resp 18   SpO2 98%   Visual Acuity Right Eye Distance:   Left Eye Distance:   Bilateral Distance:    Right Eye Near:   Left Eye Near:    Bilateral Near:     Physical Exam Constitutional:      General: She is not in acute distress. HENT:     Right Ear: Tympanic membrane normal.     Left Ear: Tympanic membrane normal.     Nose: Nose normal.     Mouth/Throat:     Mouth: Mucous membranes are moist.     Pharynx: Posterior oropharyngeal erythema present.     Tonsils: 2+ on the right. 2+ on  the left.  Cardiovascular:     Rate and Rhythm: Normal rate and regular rhythm.     Heart sounds: Normal heart sounds.  Pulmonary:     Effort: Pulmonary effort is normal. No respiratory distress.     Breath sounds: Normal breath sounds.  Neurological:     Mental Status: She is alert.      UC Treatments / Results  Labs (all labs ordered are listed, but only abnormal results are displayed) Labs Reviewed  POCT RAPID STREP A (OFFICE) - Normal    EKG   Radiology No results found.  Procedures Procedures (including critical care time)  Medications Ordered in UC Medications - No data to display  Initial Impression / Assessment and Plan / UC Course  I have reviewed the triage vital signs and the nursing notes.  Pertinent labs & imaging results that were available during my care of the patient were reviewed by me and considered in my medical decision making (see chart for details).    Acute pharyngitis, acute tonsillitis, acute sinusitis.  Rapid strep negative.  Based on exam today and length of symptoms, treating with 10-day course of amoxicillin.  Tylenol or ibuprofen as needed.   Instructed patient to follow-up with her PCP if she is not improving.  She agrees to plan of care.  Final Clinical Impressions(s) / UC Diagnoses   Final diagnoses:  Acute pharyngitis, unspecified etiology  Acute tonsillitis, unspecified etiology  Acute non-recurrent maxillary sinusitis     Discharge Instructions      The strep test is negative.    Take the amoxicillin as directed.  Follow-up with your primary care provider if your symptoms are not improving.      ED Prescriptions     Medication Sig Dispense Auth. Provider   amoxicillin (AMOXIL) 875 MG tablet Take 1 tablet (875 mg total) by mouth 2 (two) times daily for 10 days. 20 tablet Mickie Bail, NP      PDMP not reviewed this encounter.   Mickie Bail, NP 07/29/23 1009

## 2023-07-29 NOTE — ED Triage Notes (Addendum)
 Patient to Urgent Care with complaints of ear pain/ sore throat/ neck pain and swelling. Denies any known fevers.   Reports symptoms started approx one week ago. Started with left sided lymph node swelling. Now having left sided sore throat and left sided ear pain.   Taking ibuprofen (last dose this morning).
